# Patient Record
Sex: Female | Born: 1985 | Hispanic: Yes | Marital: Single | Smoking: Never smoker
Health system: Southern US, Community
[De-identification: ages and names within clinical notes are randomized; demographics above are authoritative.]

## PROBLEM LIST (undated history)

## (undated) HISTORY — PX: WISDOM TOOTH EXTRACTION: SHX21

---

## 2014-06-30 ENCOUNTER — Encounter (HOSPITAL_COMMUNITY): Payer: Self-pay | Admitting: *Deleted

## 2014-06-30 ENCOUNTER — Emergency Department (HOSPITAL_COMMUNITY): Payer: Self-pay

## 2014-06-30 ENCOUNTER — Emergency Department (HOSPITAL_COMMUNITY)
Admission: EM | Admit: 2014-06-30 | Discharge: 2014-06-30 | Disposition: A | Discharge status: Home / Self Care | Payer: Self-pay | Attending: Emergency Medicine | Admitting: Emergency Medicine

## 2014-06-30 DIAGNOSIS — K149 Disease of tongue, unspecified: Secondary | ICD-10-CM | POA: Insufficient documentation

## 2014-06-30 DIAGNOSIS — R51 Headache: Secondary | ICD-10-CM | POA: Insufficient documentation

## 2014-06-30 DIAGNOSIS — N72 Inflammatory disease of cervix uteri: Secondary | ICD-10-CM | POA: Insufficient documentation

## 2014-06-30 DIAGNOSIS — R52 Pain, unspecified: Secondary | ICD-10-CM

## 2014-06-30 DIAGNOSIS — Z3202 Encounter for pregnancy test, result negative: Secondary | ICD-10-CM | POA: Insufficient documentation

## 2014-06-30 LAB — COMPREHENSIVE METABOLIC PANEL
ALBUMIN: 4.1 g/dL (ref 3.5–5.2)
ALK PHOS: 70 U/L (ref 39–117)
ALT: 99 U/L — ABNORMAL HIGH (ref 0–35)
ANION GAP: 10 (ref 5–15)
AST: 50 U/L — ABNORMAL HIGH (ref 0–37)
BUN: 9 mg/dL (ref 6–23)
CO2: 25 mmol/L (ref 19–32)
Calcium: 9.3 mg/dL (ref 8.4–10.5)
Chloride: 104 mEq/L (ref 96–112)
Creatinine, Ser: 0.98 mg/dL (ref 0.50–1.10)
GFR calc Af Amer: >90 mL/min (ref >90)
GFR calc non Af Amer: 78 mL/min — ABNORMAL LOW (ref >90)
Glucose, Bld: 98 mg/dL (ref 70–99)
POTASSIUM: 4.1 mmol/L (ref 3.5–5.1)
Sodium: 139 mmol/L (ref 135–145)
TOTAL PROTEIN: 7.8 g/dL (ref 6.0–8.3)
Total Bilirubin: 0.7 mg/dL (ref 0.3–1.2)

## 2014-06-30 LAB — URINALYSIS, ROUTINE W REFLEX MICROSCOPIC
BILIRUBIN URINE: NEGATIVE
Glucose, UA: NEGATIVE mg/dL
HGB URINE DIPSTICK: NEGATIVE
KETONES UR: NEGATIVE mg/dL
Nitrite: NEGATIVE
Protein, ur: NEGATIVE mg/dL
SPECIFIC GRAVITY, URINE: 1.027 (ref 1.005–1.030)
UROBILINOGEN UA: 1 mg/dL (ref 0.0–1.0)
pH: 7 (ref 5.0–8.0)

## 2014-06-30 LAB — WET PREP, GENITAL
CLUE CELLS WET PREP: NONE SEEN
TRICH WET PREP: NONE SEEN
Yeast Wet Prep HPF POC: NONE SEEN

## 2014-06-30 LAB — CBC WITH DIFFERENTIAL/PLATELET
BASOS ABS: 0 10*3/uL (ref 0.0–0.1)
BASOS PCT: 0 % (ref 0–1)
EOS ABS: 0.2 10*3/uL (ref 0.0–0.7)
Eosinophils Relative: 2 % (ref 0–5)
HCT: 40.6 % (ref 36.0–46.0)
Hemoglobin: 14.7 g/dL (ref 12.0–15.0)
Lymphocytes Relative: 32 % (ref 12–46)
Lymphs Abs: 2.4 10*3/uL (ref 0.7–4.0)
MCH: 32 pg (ref 26.0–34.0)
MCHC: 36.2 g/dL — AB (ref 30.0–36.0)
MCV: 88.3 fL (ref 78.0–100.0)
Monocytes Absolute: 0.7 10*3/uL (ref 0.1–1.0)
Monocytes Relative: 10 % (ref 3–12)
NEUTROS ABS: 4.1 10*3/uL (ref 1.7–7.7)
NEUTROS PCT: 56 % (ref 43–77)
PLATELETS: 256 10*3/uL (ref 150–400)
RBC: 4.6 MIL/uL (ref 3.87–5.11)
RDW: 12.4 % (ref 11.5–15.5)
WBC: 7.4 10*3/uL (ref 4.0–10.5)

## 2014-06-30 LAB — URINE MICROSCOPIC-ADD ON

## 2014-06-30 LAB — POC URINE PREG, ED: Preg Test, Ur: NEGATIVE

## 2014-06-30 MED ORDER — AZITHROMYCIN 250 MG PO TABS
1000.0000 mg | ORAL_TABLET | Freq: Once | ORAL | Status: AC
  Administered 2014-06-30: 1000 mg via ORAL
  Filled 2014-06-30: qty 4

## 2014-06-30 MED ORDER — CEFTRIAXONE SODIUM 250 MG IJ SOLR
250.0000 mg | Freq: Once | INTRAMUSCULAR | Status: AC
  Administered 2014-06-30: 250 mg via INTRAMUSCULAR
  Filled 2014-06-30: qty 250

## 2014-06-30 MED ORDER — CHLORHEXIDINE GLUCONATE 0.12 % MT SOLN: 15.0000 mL | Freq: Two times a day (BID) | OROMUCOSAL | Status: AC

## 2014-06-30 MED ORDER — LIDOCAINE HCL (PF) 1 % IJ SOLN
INTRAMUSCULAR | Status: AC
  Administered 2014-06-30: 0.8 mL
  Filled 2014-06-30: qty 5

## 2014-06-30 MED ORDER — LIDOCAINE HCL (PF) 1 % IJ SOLN
0.8000 mL | Freq: Once | INTRAMUSCULAR | Status: AC
  Administered 2014-06-30: 0.8 mL

## 2014-06-30 NOTE — Discharge Instructions (Signed)
Dolor abdominal (Abdominal Pain) El dolor de estmago (abdominal) puede tener muchas causas. La mayora de las veces, el dolor de [place] no es peligroso. Muchos de [employer] de dolor de estmago pueden controlarse y tratarse en casa. CUIDADOS EN EL HOGAR   No tome medicamentos que lo ayuden a defecar (laxantes), salvo que su mdico se lo indique.  Solo tome los medicamentos que le haya indicado su mdico.  Coma o beba lo que le indique su mdico. Su mdico le dir si debe seguir una dieta especial. SOLICITE AYUDA SI:  No sabe cul es la causa del dolor de [place].  Tiene dolor de estmago cuando siente ganas de vomitar (nuseas) o tiene colitis (diarrea).  Tiene dolor durante la miccin o la evacuacin.  El dolor de estmago lo despierta de noche.  Tiene dolor de Mirant empeora o [place] cuando come.  Tiene dolor de Mirant empeora cuando come CIGNA.  Tiene fiebre. SOLICITE AYUDA DE INMEDIATO SI:   El dolor no desaparece en un plazo mximo de 2horas.  No deja de (vomitar).  El dolor cambia y se Librarian, academic solo en la parte derecha o izquierda del [place].  La materia fecal es sanguinolenta o de aspecto alquitranado. ASEGRESE DE QUE:   Comprende estas instrucciones.  Controlar su afeccin.  Recibir ayuda de inmediato si no mejora o si empeora. Document Released: 09/01/2008 Document Revised: 06/10/2013 [facility] Patient Information 2015 [place], [place]. This information is not intended to replace advice given to you by your health care provider. Make sure you discuss any questions you have with your health care provider. Glositis (Glossitis) La glositis es una inflamacin de la [place]. Los [employer] en el aspecto de la lengua pueden deberse a un trastorno primario de este rgano. Esto significa que el problema se encuentra slo en la lengua. Tambin puede ser el sntoma de otros trastornos.  CAUSAS  Infecciones  Alergias mltiples  Consumo  excesivo de alcohol  Lesiones mecnicas  Anemia  Consumo de tabaco y nicotina  Comidas muy condimentadas  Dficit de vitamina B  Lesiones producidas por sustancias qumicas o bebidas o alimentos calientes SNTOMAS Puede haber hinchazn y [employer]. Algunas veces la superficie de la lengua puede verse lisa. Este trastorno puede ser indoloro. Pero generalmente la lengua duele y est sensible. Puede estar de color rojo ardiente si el problema est ocasionado por un dficit de vitamina B. Si est plida puede tratarse de anemia. Anemia significa que no hay suficientes glbulos rojos en la sangre. Puede haber problemas para Product manager, tragar y hablar. En algunos casos, la glositis puede dar como resultado una hinchazn grave que bloquea el paso del [place]. DIAGNSTICO El diagnstico de la glositis se realiza a travs del examen fsico y del interrogatorio. Podrn realizarle anlisis de [place]. TRATAMIENTO  El objetivo del tratamiento es reducir la inflamacin. Generalmente la hospitalizacin no es necesaria, a menos que la hinchazn de la lengua sea grave.  Tambin es importante una buena higiene bucal. Esto significa que debe cepillarse los dientes por lo menos dos veces al da y pasar diariamente el hilo dental para un buen tratamiento y prevencin.  Los corticoides como la prednisona pueden administrarse para disminuir el enrojecimiento y Youth worker.  En caso de existir infeccin, le prescribirn medicamentos.  Tambin se tratarn otros problemas como la anemia o los dficit nutricionales. Esto puede consistir en una modificacin de la dieta o en la administracin de suplementos vitamnicos. Evite las comidas muy condimentadas, el alcohol  y el tabaco. Esto disminuir las [place].  Evite todo lo que sea irritante para su boca o lengua. La glositis generalmente responde bien si se suprime la causa o se implementa un tratamiento.  SOLICITE ATENCIN MDICA DE INMEDIATO SI:  Los  sntomas de glositis persisten durante ms de [employer].  La hinchazn de la lengua es grave y presenta dificultad para respirar, Heritage manager, Product manager o tragar.  No obtiene Saks Incorporated.  Presenta dificultades respiratorias. Document Released: 06/05/2005 Document Revised: 08/28/2011 [facility] Patient Information 2015 [place], [place]. This information is not intended to replace advice given to you by your health care provider. Make sure you discuss any questions you have with your health care provider. Cervicitis Cervicitis is a soreness and puffiness (inflammation) of the cervix.  HOME CARE  Do not have sex (intercourse) until your doctor says it is okay.  Do not have sex until your partner is treated or as told by your doctor.  Take your antibiotic medicine as told. Finish it even if you start to feel better. GET HELP IF:   Your symptoms that brought you to the doctor come back.  You have a fever. MAKE SURE YOU:   Understand these instructions.  Will watch your condition.  Will get help right away if you are not doing well or get worse. Document Released: 03/14/2008 Document Revised: 06/10/2013 Document Reviewed: 11/27/2012 [facility] Patient Information 2015 [place], [place]. This information is not intended to replace advice given to you by your health care provider. Make sure you discuss any questions you have with your health care provider.  Emergency Department Resource Guide 1) Find a Doctor and Pay Out of Pocket Although you won't have to find out who is covered by your insurance plan, it is a good idea to ask around and get recommendations. You will then need to call the office and see if the doctor you have chosen will accept you as a new patient and what types of options they offer for patients who are self-pay. Some doctors offer discounts or will set up payment plans for their patients who do not have insurance, but you will need to ask so you aren't surprised when  you get to your appointment.  2) Contact Your Local Health Department Not all health departments have doctors that can see patients for sick visits, but many do, so it is worth a call to see if yours does. If you don't know where your local health department is, you can check in your phone book. The CDC also has a tool to help you locate your state's health department, and many state websites also have listings of all of their local health departments.  3) Find a Walk-in Clinic If your illness is not likely to be very severe or complicated, you may want to try a walk in clinic. These are popping up all over the country in pharmacies, drugstores, and shopping centers. They're usually staffed by nurse practitioners or physician assistants that have been trained to treat common illnesses and complaints. They're usually fairly quick and inexpensive. However, if you have serious medical issues or chronic medical problems, these are probably not your best option.  No Primary Care Doctor: - Call Health Connect at  [phone] - they can help you locate a primary care doctor that  accepts your insurance, provides certain services, etc. - Physician Referral Service- [phone]  Chronic Pain Problems: Organization         Address  Phone   Notes  [facility]  [phone] Patients need to be referred by their primary care doctor.   Medication Assistance: Organization         Address  Phone   Notes  [facility] Medication [facility] [address] [place], [place] [address] [phone]) [phone] --Must be a resident of [facility] -- Must have NO insurance coverage whatsoever (no Medicaid/ Medicare, etc.) -- The pt. MUST have a primary care doctor that directs their care regularly and follows them in the community   MedAssist  [phone]) [phone]   [employer]  [phone]) [phone]    Agencies that provide inexpensive medical care: Organization         Address  Phone    Notes  [facility]  [phone]   [facility]    [phone]) [phone]   [facility] [address] [place], [place] [address] [phone]) [phone]   [facility] [place] [address], [place] [phone]) [phone]   Planned Parenthood    [phone]   [facility]    [phone]) [phone]   [facility]  [address], [place] Phone:  [phone], Fax:  [phone]) [phone] Hours of Operation:  9 am - 6 pm, M-F.  Also accepts Medicaid/Medicare and self-pay.  [facility]  [address], [place] Phone: [phone], Fax: [phone]. Hours of Operation:  8:30 am - 5:30 pm, M-F.  Also accepts Medicaid and self-pay.  [facility] [place] [address], [place] Phone: [phone](336) [phone]   [facility] [address], [place], [place] [phone], Ext. [phone] Mondays & Thursdays: 7-9 AM.  First 15 patients are seen on a first come, first serve basis.    Medicaid-accepting [facility] Providers:  Organization         Address  Phone   Notes  [facility] [address], [place] [phone]) [phone] Also accepts self-pay patients.  [facility] [address], [place]  [phone]) [phone]   [facility] [address], [place] [phone]   [facility] [address] [place] [address], [place] [phone]) [phone]   Renaye RakersVeita Bland [address], [place]   [phone]) [phone] Only accepts [place] Access [place] patients after they have their name applied to their card.   Self-Pay (no insurance) in [facility]:  Organization         Address  Phone   Notes  Sickle Cell Patients, [facility] [address], [place] [phone]) [phone]   [facility] [address], [place] [phone]   [facility] [place]  [address], [place] [phone]) [phone]   [facility]/Dr. Osei-Bonsu  [address] [place] [address], [place] or [id number], [place] [phone] Phone number for both [place] and [place] locations is the same.  [facility] and [facility] [address], [place] [phone]) [phone]   [facility] [place] [address] [place] [address], [place] or [address] [phone]) [phone]   [facility] [address], [place] [phone]) [phone], phone; [phone], fax Sees patients 1st and 3rd Saturday of every month.  Must not qualify for public or private insurance (i.e. Medicaid,  Medicare, [place] Health Choice, Veterans' Benefits)  Household income should be no more than 200% of the poverty level The clinic cannot treat you if you are pregnant or think you are pregnant  Sexually transmitted diseases are not treated at the clinic.    Dental Care: Organization         Address  Phone  Notes  [facility] of [facility] [address], [place] [phone]) [phone] Accepts children up to age 29 who are enrolled in [place]icaid or [place] Health Choice; pregnant women with a Medicaid card; and children who have applied for Medicaid or [place] Health Choice, but were declined, whose parents can pay a reduced fee at time of service.  [facility] of [facility] [place]  [address], [place] [phone]) [phone] Accepts children up to age 29 who are enrolled in [place]icaid or [place] Health Choice; pregnant women with a Medicaid card; and children who have applied for Medicaid or [place] Health Choice, but were declined, whose parents can pay a reduced fee at time of service.  [facility]  [address], [place] [phone]) [phone] Patients are seen by appointment only. Walk-ins are not accepted. [facility] will see patients 29 years of age  and older. Monday - Tuesday (8am-5pm) Most Wednesdays (8:30-5pm) $30 per visit, cash only  [facility]  [address], [facility] [phone]) [phone] Patients are seen by appointment only. Walk-ins are not accepted. [facility] will see patients 29 years of age and older. One Wednesday Evening (Monthly: Volunteer Based).  $30 per visit, cash only  [facility]  [phone]) [phone] for adults; Children under age 464, call Graduate Pediatric Dentistry at [phone]) [phone]. Children aged 784-14, please call [phone]) [phone] to request a pediatric application.  Dental services are provided in all areas of dental care including fillings, crowns and bridges, complete and partial dentures, implants, gum treatment, root canals, and extractions. Preventive care is also provided. Treatment is provided to both adults and children. Patients are selected via a lottery and there is often a waiting list.   [facility] [address], [place]  [phone]) [phone] [email or web address]   [facility] [address], [place], [place] [phone], Ext. [phone] Second and Fourth Thursday of each month, opens at 6:30 AM; Clinic ends at 9 AM.  Patients are seen on a first-come first-served basis, and a limited number are seen during each clinic.   [facility]  [address], [place], [place] [phone]) [phone]   Eligibility Requirements You must have lived in [place], [place], or [place] counties for at least the last three months.   You cannot be eligible for state or federal sponsored National Cityhealthcare insurance, including CIGNAVeterans Administration, [place]icaid, or Harrah's EntertainmentMedicare.   You generally cannot be eligible for healthcare insurance through your employer.    How to apply: Eligibility screenings are held every Tuesday and Wednesday afternoon from 1:00 pm until 4:00 pm. You do not need an appointment for the interview!  [facility] [address], [place], [place] [phone]   [facility]  [phone]   [facility]  [phone]   [facility]  [phone]    Behavioral Health Resources in the Community: Intensive Outpatient Programs Organization         Address  Phone  Notes  [facility]  Services [address], [place], [place] [phone]   [facility]patient [address] [place], [place] [phone]   [facility]: [facility] S[facility] [address] [place], [place]  [phone]   [facility] [address],  [place], [place] [phone] or [phone]   Substance Abuse Resources Organization         Address  Phone  Notes  Alcohol and Drug Services  [phone]   [facility]  [phone]   [facility]  [phone]   Floydene Flock  [phone]   Residential & Outpatient Substance Abuse Program  [phone]   Psychological Services Organization         Address  Phone  Notes  [facility]  [phone]   [facility] Services  [phone]   [facility] Mental Health [address] [place] [phone] or [phone]    Mobile Crisis Teams Organization         Address  Phone  Notes  [facility], Mobile Crisis Care Unit  [phone]   [facility]otherapeutic [facility]  [address] [place], [place] [phone]   Doristine Locks [address] [place] [phone]    Self-Help/Support Groups Organization         Address  Phone             Notes  [facility]ental Health Assoc. of [place] - variety of support groups  [phone]- I7437963 Call for more information  Narcotics Anonymous (NA), [facility]aring [facility] [address], [employer] [place]  2 meetings at this location   Statistician         Address  Phone  Notes  [facility] [address],    [place]  [phone]   [facility]  [address] [place] [address], [place], [place] [phone]   [facility] [address] [place], [place] [phone] Admissions: 8am-3pm M-F  [facility] [address]    [place], [place] [phone]   [facility] [address] [place], [place], [place] [phone]   [facility] [address],  [place], [place] [phone]   [facility] - Intensive Outpatient [address], [place], [place] [phone]   [facility] (Addiction Recovery [facility] [address]  [place], [place] [phone] or [phone]   [facility] (RTS) [address] [place], [place] [phone] Accepts Medicaid  [facility] [address],  [place] [phone] Substance Abuse/Addiction Treatment   [facility] Organization         Address  Phone  Notes  [facility]  [phone]   Angie Fava, PhD [address]vin Knack [place], [place]   [phone] or [phone]   [facility]   [address] [place], [place] [phone]   [facility] [address], [place], [place] [phone] Insurance/Medicaid/sponsorship through [facility] and Families [address]                                    [place], [place] [phone] Therapy/tele-psych/case  [facility] [address], [place] [phone]    Dr. Lolly Mustache  [phone]   [facility]  United Way [facility] Dept. 1) [address], [place] 2) [place] 3)  [address], [place] [phone]  [phone]   [place] [phone] or [phone] (After Hours)

## 2014-06-30 NOTE — ED Notes (Addendum)
Patient reports she has had abd pain for almost one month.  She states it feels like heat in her abdomen.  She states she is also having back pain.  She points to lower back and lower abdomen..  Patient also has noticed spots on her tongue.  She has no appetite.  She states she has only drank water.  Patient states she has vaginal discharge with foul odor.  Patient last period was on 05-22-14.  No period this month.  Patient denies pain when voiding.  .  Patient states she also has headache, denies sore throat.  Patient speaks little english, info obtained by interpreter [phone].

## 2014-06-30 NOTE — ED Provider Notes (Signed)
CSN: [id number]     Arrival date & time 06/30/14  1348 History   None    Chief Complaint  Patient presents with  . Abdominal Pain  . Headache     (Consider location/radiation/quality/duration/timing/severity/associated sxs/prior Treatment) Patient is a 29 y.o. female presenting with abdominal pain. The history is provided by the patient. No language interpreter was used.  Abdominal Pain Pain location:  LLQ and RLQ Pain quality: aching and cramping   Pain radiates to:  Does not radiate Pain severity:  Moderate Onset quality:  Gradual Duration:  4 weeks Timing:  Constant Progression:  Worsening Chronicity:  New Context: not suspicious food intake   Relieved by:  Nothing Worsened by:  Nothing tried Ineffective treatments:  None tried Associated symptoms: no nausea   Pt also reports she has spots on her tongue.   History reviewed. No pertinent past medical history. Past Surgical History  Procedure Laterality Date  . Wisdom tooth extraction     No family history on file. History  Substance Use Topics  . Smoking status: Never Smoker   . Smokeless tobacco: Not on file  . Alcohol Use: No   OB History    No data available     Review of Systems  Gastrointestinal: Positive for abdominal pain. Negative for nausea.  Skin: Positive for rash.  All other systems reviewed and are negative.     Allergies  Review of patient's allergies indicates no known allergies.  Home Medications   Prior to Admission medications   Not on File   BP 115/68 mmHg  Pulse 72  Temp(Src) 98.9 F (37.2 C) (Oral)  Resp 16  SpO2 100% Physical Exam  Constitutional: She is oriented to person, place, and time. She appears well-developed and well-nourished.  HENT:  Head: Normocephalic.  Nose: Nose normal.  Small brown dots tongue.    Eyes: Conjunctivae and EOM are normal. Pupils are equal, round, and reactive to light.  Neck: Normal range of motion.  Cardiovascular: Normal rate and normal  heart sounds.   Pulmonary/Chest: Effort normal and breath sounds normal.  Abdominal: Soft. She exhibits no distension.  Genitourinary: Uterus normal. Vaginal discharge found.  Cervix erythematous, tender to palpation  Musculoskeletal: Normal range of motion.  Neurological: She is alert and oriented to person, place, and time.  Skin: Skin is warm. Rash noted.  Psychiatric: She has a normal mood and affect.  Nursing note and vitals reviewed.   ED Course  Procedures (including critical care time) Labs Review Labs Reviewed  CBC WITH DIFFERENTIAL - Abnormal; Notable for the following:    MCHC 36.2 (*)    All other components within normal limits  COMPREHENSIVE METABOLIC PANEL  URINALYSIS, ROUTINE W REFLEX MICROSCOPIC  POC URINE PREG, ED    Imaging Review No results found.   EKG Interpretation None      MDM  Ultrasound normal.   Pt has thick white discharge,  Cervix is tender,     Final diagnoses:  Pain  Cervicitis  Tongue disorder   I advised follow up with dentist for evaluation Peridex Rocephin Zithromax    Elson AreasLeslie K Nickolis Diel, PA-C 06/30/14 1834  Tilden FossaElizabeth Rees, MD 06/30/14 2344

## 2014-07-02 LAB — GC/CHLAMYDIA PROBE AMP
CT Probe RNA: NEGATIVE
GC Probe RNA: NEGATIVE

## 2016-04-10 IMAGING — US US PELVIS COMPLETE
1 series · 14 of 25 positions shown · non-contrast
Comparison: None

CLINICAL DATA: Low back pain, discharge for 2 weeks

EXAM:
TRANSABDOMINAL AND TRANSVAGINAL ULTRASOUND OF PELVIS
TECHNIQUE: Both transabdominal and transvaginal ultrasound examinations of the
pelvis were performed. Transabdominal technique was performed for
global imaging of the pelvis including uterus, ovaries, adnexal
regions, and pelvic cul-de-sac. It was necessary to proceed with
endovaginal exam following the transabdominal exam to visualize the
endometrium and ovaries.

[Series 1: us pelvis complete · 0.24mm/px · 14 of 79 slices shown]
[im 1/79]
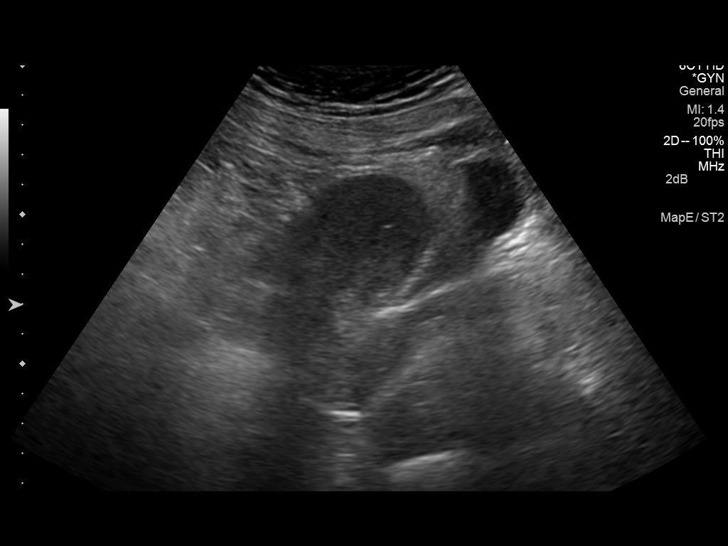
[im 7/79]
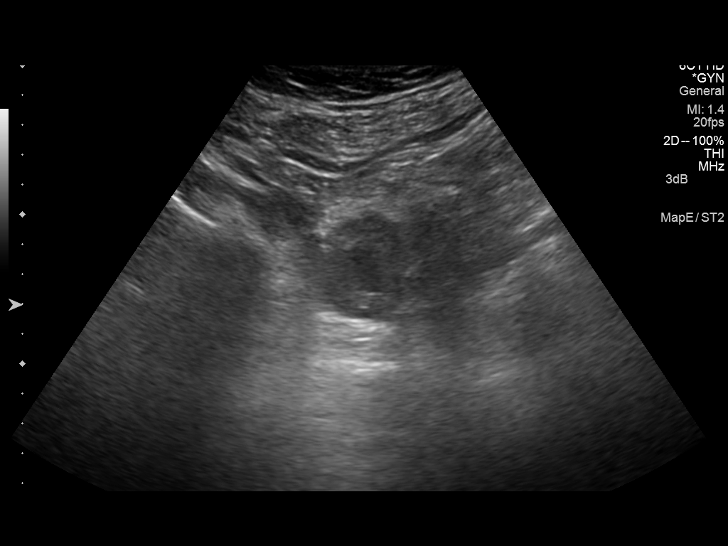
[im 14/79]
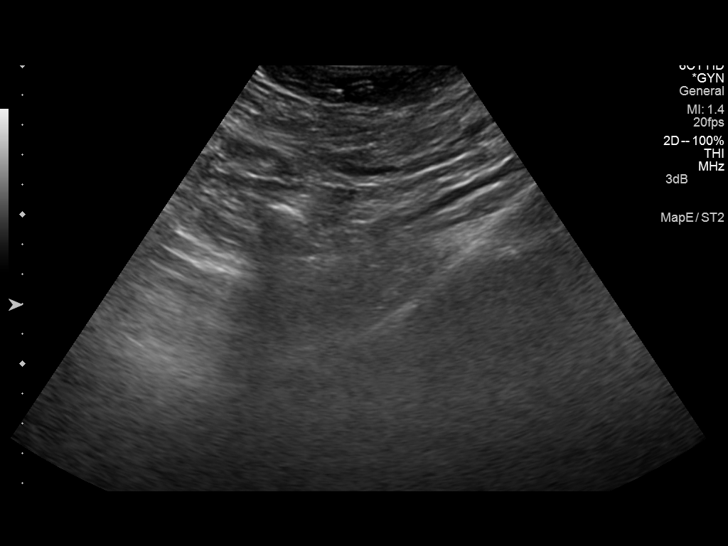
[im 20/79]
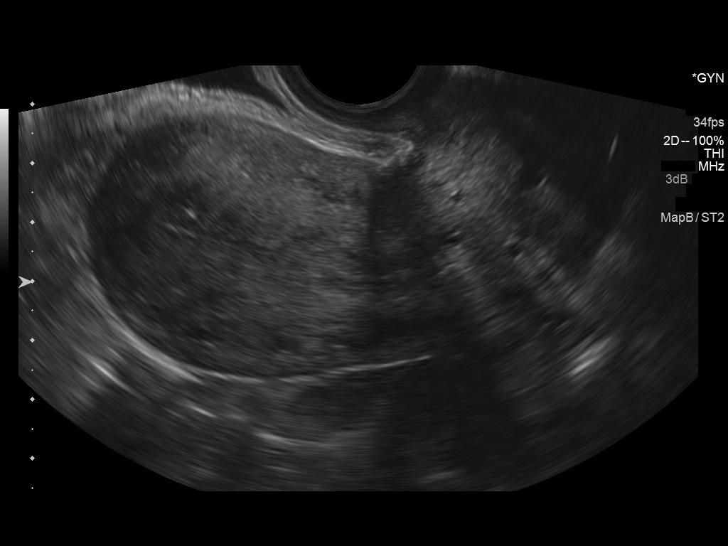
[im 27/79]
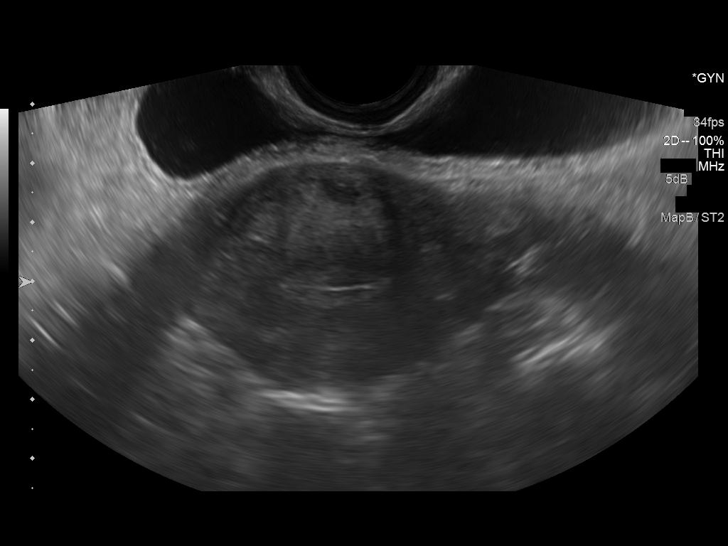
[im 30/79]
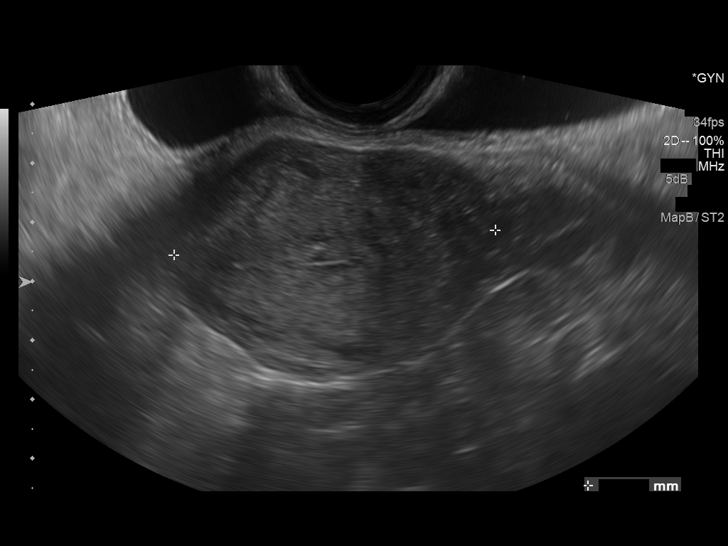
[im 36/79]
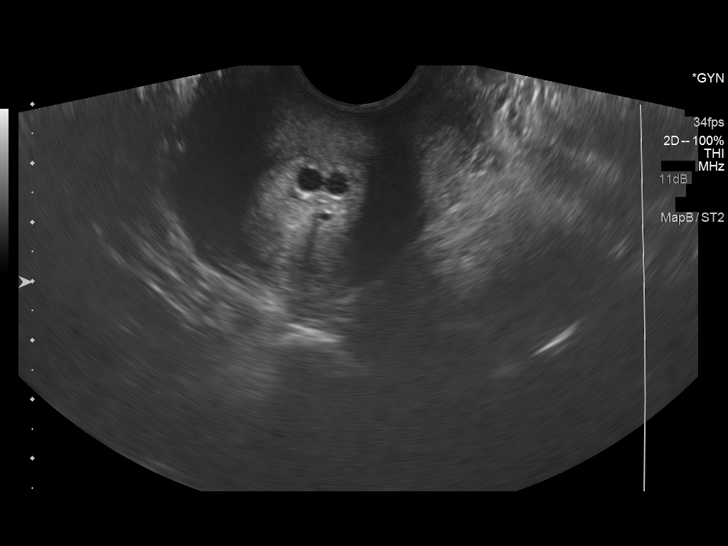
[im 43/79]
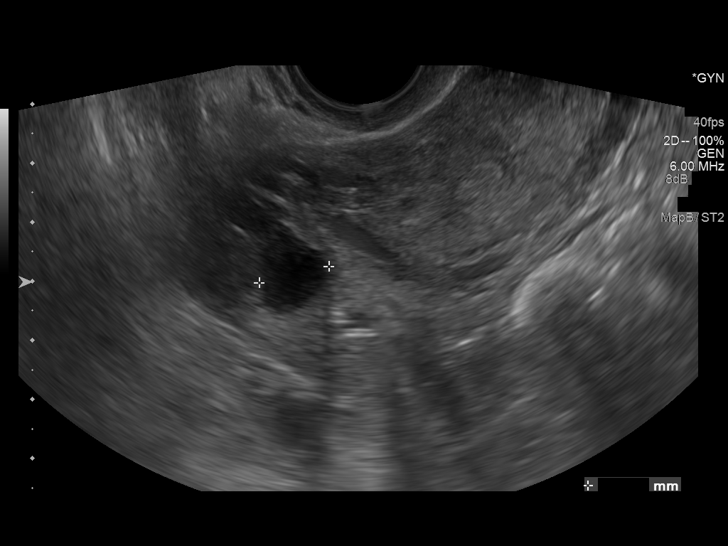
[im 49/79]
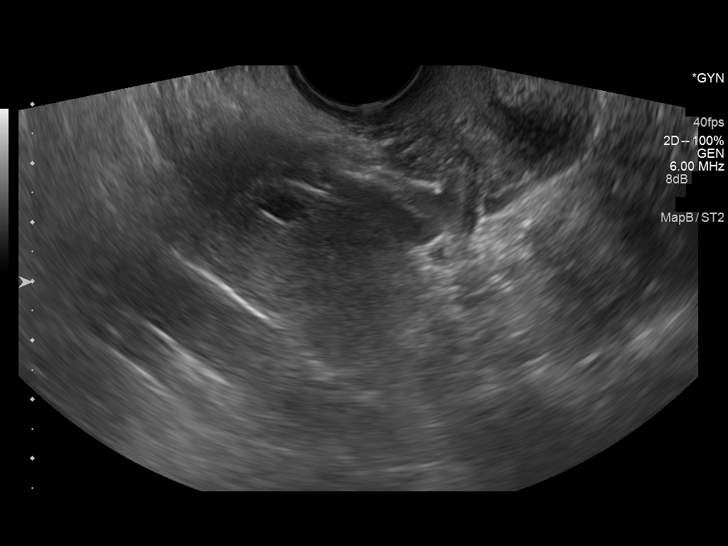
[im 53/79]
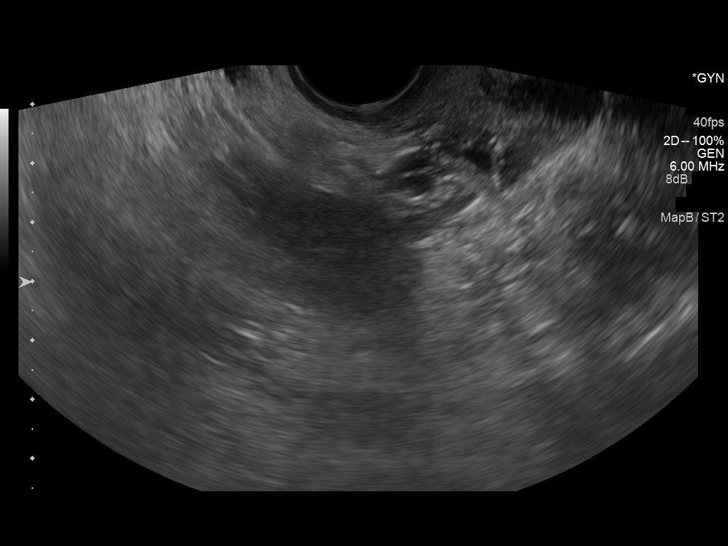
[im 59/79]
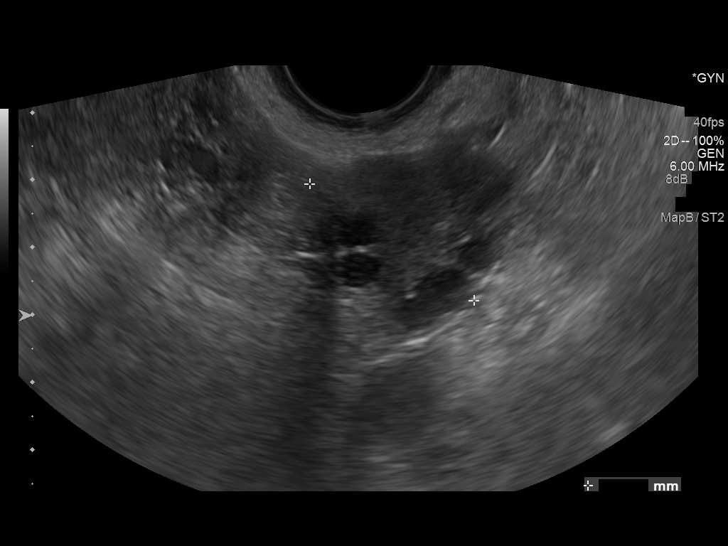
[im 66/79]
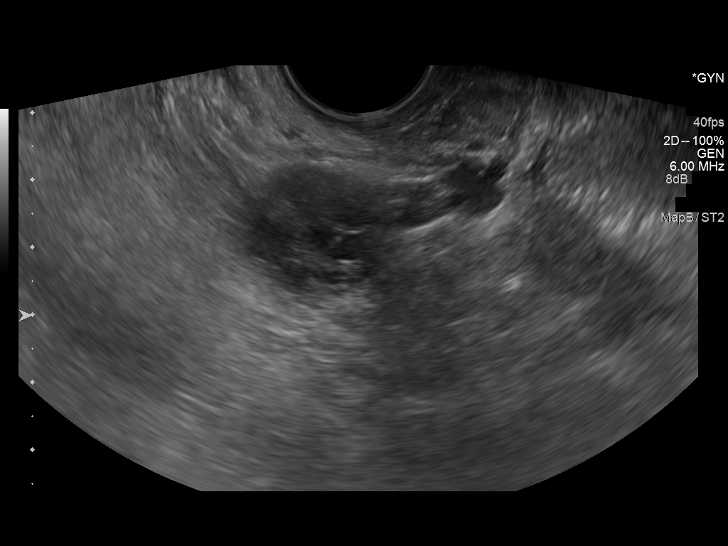
[im 72/79]
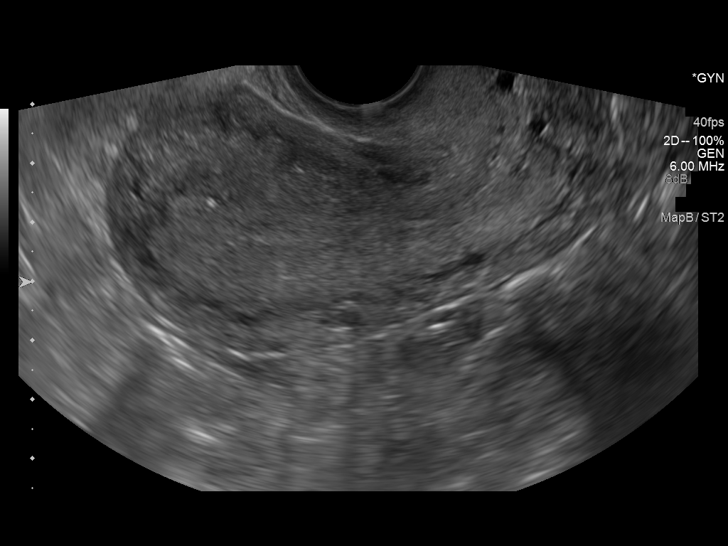
[im 79/79]
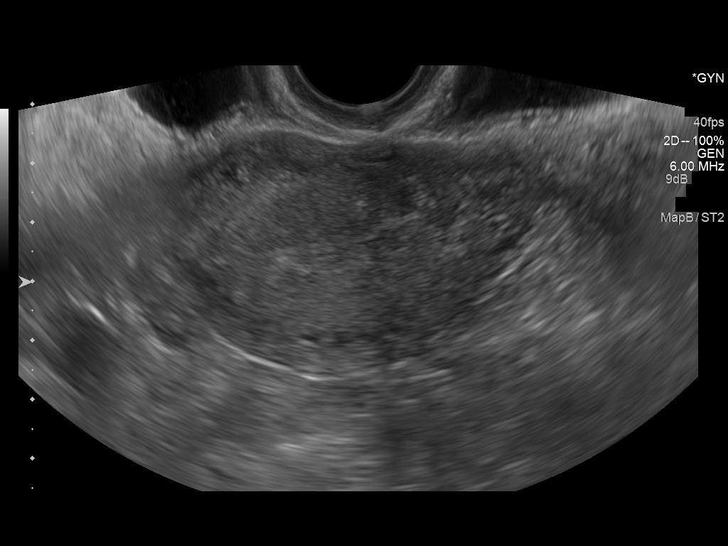

[14 of 25 positions shown; findings below may reference images not displayed]

FINDINGS: Uterus

Measurements: 5.1 x 4.5 x 5.5 cm. No fibroids or other mass
visualized.

Endometrium

Thickness: 9.6 mm.  No focal abnormality visualized.

Right ovary

Measurements: 4.8 x 3.4 x 2.6 cm. Normal appearance/no adnexal mass.

Left ovary

Measurements: 3 x 3 x 3 cm. Normal appearance/no adnexal mass.

Other findings

No free fluid.
IMPRESSION: Normal pelvic ultrasound.

## 2018-09-20 ENCOUNTER — Other Ambulatory Visit: Payer: Self-pay | Admitting: Certified Nurse Midwife
